# Patient Record
Sex: Female | Born: 1935 | Race: White | Hispanic: No
Health system: Southern US, Community
[De-identification: ages and names within clinical notes are randomized; demographics above are authoritative.]

---

## 2003-04-24 ENCOUNTER — Other Ambulatory Visit: Payer: Self-pay

## 2004-03-10 ENCOUNTER — Ambulatory Visit: Payer: Self-pay | Admitting: Family Medicine

## 2006-04-04 ENCOUNTER — Ambulatory Visit: Payer: Self-pay | Admitting: Ophthalmology

## 2006-08-22 ENCOUNTER — Ambulatory Visit: Payer: Self-pay | Admitting: Family Medicine

## 2006-12-07 ENCOUNTER — Ambulatory Visit: Payer: Self-pay | Admitting: Family Medicine

## 2007-04-03 ENCOUNTER — Emergency Department: Payer: Self-pay | Admitting: Emergency Medicine

## 2007-04-04 ENCOUNTER — Ambulatory Visit: Payer: Self-pay | Admitting: Emergency Medicine

## 2007-04-14 ENCOUNTER — Inpatient Hospital Stay (HOSPITAL_COMMUNITY): Admission: RE | Admit: 2007-04-14 | Discharge: 2007-04-18 | Payer: Self-pay | Admitting: Neurosurgery

## 2007-04-18 ENCOUNTER — Encounter: Payer: Self-pay | Admitting: Internal Medicine

## 2007-11-11 ENCOUNTER — Other Ambulatory Visit: Payer: Self-pay

## 2007-11-12 ENCOUNTER — Inpatient Hospital Stay: Payer: Self-pay | Admitting: *Deleted

## 2010-06-30 NOTE — Discharge Summary (Signed)
NAMEDESHARA, ROSSI NO.:  [id number]   MEDICAL RECORD NO.:  [id number]          PATIENT TYPE:  INP   LOCATION:  3036                         FACILITY:  [facility]   PHYSICIAN:  Danae Orleans. Venetia Maxon, M.D.  DATE OF BIRTH:  Sep 20, 1916   DATE OF ADMISSION:  04/14/2007  DATE OF DISCHARGE:  04/18/2007                               DISCHARGE SUMMARY   ADMISSION DIAGNOSES:  Herniated cervical disk with cervical myelopathy,  spondylosis, myelopathy, cervical stenosis, and kyphotic cervical  deformity with transient quadriparesis, status post fall.   HISTORY OF PRESENT ILLNESS:  She underwent an ACDF of C3-4, C5-6 and  recovered in the Intensive Care Unit for 1 night. Her postoperative stay  has been uneventful. Mild dysphagia is expected. Her incision is without  any signs of infection, inflammation, or tenderness. Of note is also the  finding of rib fractures, also resulting from that fall. She is found to  have benefit from a skilled nursing facility during her recovery, as her  family is unable to provide that at this time. Plans are underway to  transfer Ms. Arzuaga to [facility] for this care. She will followup in the  office in 2 to 4 weeks.   ALLERGIES:  PENICILLIN, SULFA.   CURRENT MEDICATIONS:  1. Boniva 150 mg taken p.o. on the 30th of each month.  2. Vitamin C 500 mg daily.  3. Tylenol Arthritis p.o. p.r.n.  4. Glucosamine b.i.d.  5. Calcium 600 mg with vitamin D twice daily.  6. Pravastatin 40 mg q. 0800 hours daily.  7. Primidone 125 mg daily.  8. Syllium fiber pack, 1 packet q.h.s.  9. Hydrocodone 5/325 1 to 2 p.o. q.4 to 6 hours p.r.n. pain.  10.Percocet 4/325 1 to 2 p.o. q.4 to 6 hours p.r.n. pain.  11.Flexeril 5 mg 1 p.o. q.8 hours p.r.n. spasm.  12.Valium 5 mg 1 p.o. q.8 hours p.r.n. spasm.  13.Ativan 0.5 mg daily p.r.n.  14.Ambien 5 mg q.h.s. p.r.n. insomnia.   FOLLOWUP:  The patient will followup in the office with [place] in 2 to 4  weeks.      Danae Orleans. Venetia Maxon, M.D.  Electronically Signed     JDS/MEDQ  D:  04/18/2007  T:  04/18/2007  Job:  [id number]

## 2010-06-30 NOTE — Op Note (Signed)
Tricia Mcmahon, Tricia Mcmahon NO.:  [id number]   MEDICAL RECORD NO.:  [id number]          PATIENT TYPE:  INP   LOCATION:  2899                         FACILITY:  [facility]   PHYSICIAN:  Danae Orleans. Venetia Maxon, M.D.  DATE OF BIRTH:  22-Apr-1916   DATE OF PROCEDURE:  04/14/2007  DATE OF DISCHARGE:                               OPERATIVE REPORT   PREOPERATIVE DIAGNOSIS:  Herniated cervical disc with cervical  myelopathy, spondylosis, myelopathy, cervical stenosis, and kyphotic  cervical deformity with transient quadriparesis status post fall.   POSTOPERATIVE DIAGNOSIS:  Herniated cervical disc with cervical  myelopathy, spondylosis, myelopathy, cervical stenosis, and kyphotic  cervical deformity with transient quadriparesis status post fall.   PROCEDURE:  Anterior cervical decompression and fusion C3-C4 and C5-C6  with allograft and autograft anterior cervical plate C3 through C6  level.   SURGEON:  Danae Orleans. Venetia Maxon, M.D.   ASSISTANT:  Stefani Dama, M.D.  Georgiann Cocker, R.N.   ANESTHESIA:  General endotracheal anesthesia.   BLOOD LOSS:  Minimal.   COMPLICATIONS:  None.   DISPOSITION:  Recovery.   INDICATIONS:  Tricia Mcmahon is an 77 year old lady who fell and struck  her head and was rendered transiently quadriplegic.  She subsequently  improved but had an MRI at [facility] which  demonstrated marked spinal stenosis with cord edema at C3-C4 and also,  to a lesser degree, at the C5-C6 level.  She appeared to have autofusion  at C4-C5.  It was elected to take her to surgery for decompression and  fusion.   OPERATIVE PROCEDURE:  [name] was brought to the operating room.  Following satisfactory and uncomplicated induction of general  endotracheal anesthesia and placement of intravenous lines, the patient  was placed in a supine position on the operating table.  Her neck was  maintained in neutral alignment.  She was placed in 5 pounds halter  traction.  Her anterior neck was then prepped and draped in the usual  sterile fashion.  The area of planned incision was infiltrated with  0.25% Marcaine and 0.5% lidocaine with 1:200,000 epinephrine.  An  incision was made in the midline to the anterior border of the  sternocleidomastoid muscle, carried sharply through platysma layer.  Subplatysmal dissection was performed exposing the anterior border  of  the sternocleidomastoid muscle using blunt dissection. The carotid  sheath was kept lateral and trachea and esophagus kept medial. Bent  spinal needles were placed at what was felt to be the C3-C4 and C4-C5  levels and this was confirmed on intraoperative x-ray.  Subsequently,  the longus colli muscles were taken down from the anterior cervical  spine bilaterally using electrocautery, Key elevator, and self-retaining  Shadowline up and down retractors were placed to facilitate exposure.  Ventral osteophytes were removed.  The patient had significant  osteopenia. The C4-C5 level on closer inspection did, in fact, appear to  be autofused and consequently, it was elected to perform decompression  at C3-C4 and C5-C6 levels.  The distraction pins were placed at C3 and  C4.  The interspace was opened.  The bone was quite  osteopenic. There  was a large osteophyte.  The bone spurs were removed and the spinal cord  dura was decompressed. Under microscopic visualization, there appeared  to be fusion of the ligaments to the dura, the ligament was removed  almost in its entirety and the dura appeared to be well decompressed.  Hemostasis was assured with Gelfoam soaked in thrombin.  After trial  sizing, a 7 mm bone allograft wedge was selected, fashioned with a high  speed drill, packed with morcellized bone autograft and demineralized  bone matrix, inserted in the interspace and countersunk appropriately.   Attention was then turned to the C5-C6 level where similar placement of  distraction pins  was performed.  This level was quite degenerated.  There was a large amount of osteophytic of bone and this was removed.  The posterior longitudinal ligament was removed and this appeared to be  less fused to the dura and consequently, the dura was more extensively  decompressed bilaterally along with decompression along the nerve roots  bilaterally.  Hemostasis was again assured and after trial sizing, a 6  mm allograft bone wedge was selected, fashioned with a high speed drill,  packed with morcellized bone autograft and demineralized bone matrix,  inserted in the interspace and countersunk appropriately.  A three level  anterior cervical plate was then straightened somewhat to be fixed  better to the spine and was affixed to the cervical spine with variable  angle 14 mm screws, two at C3, two at C4, two at C5, and two at C6.  All  screws had excellent purchase and locking mechanisms were engaged.  The  intervening screws at C4 and C5 did appear to be in fairly soft bone,  but the C3 and C6 screws appeared to have good purchase.  Final x-ray  demonstrated well positioned interbody grafts and anterior cervical  plate.   Hemostasis was assured.  The soft tissues were inspected and found to be  in good repair.  The platysmal layer was then closed with 3-0 Vicryl  sutures and the skin edges were reapproximated with 3-0 Vicryl  interrupted inverted sutures.  The wound was dressed with Dermabond.  The patient was extubated in the operating room and taken to the  recovery room in stable satisfactory condition, having tolerated the  operation well.  Counts were correct at the end of the case.      Danae Orleans. Venetia Maxon, M.D.  Electronically Signed     JDS/MEDQ  D:  04/14/2007  T:  04/15/2007  Job:  [id number]

## 2010-11-09 LAB — CBC
HCT: 36.2
Hemoglobin: 12.2
MCHC: 33.6
MCV: 99.1
Platelets: 170
RDW: 13.6

## 2010-11-09 LAB — BASIC METABOLIC PANEL
BUN: 9
CO2: 28
Chloride: 102
Glucose, Bld: 99
Potassium: 4.3
Sodium: 136

## 2011-02-14 ENCOUNTER — Inpatient Hospital Stay: Payer: Self-pay | Admitting: Internal Medicine

## 2011-02-16 LAB — COMPREHENSIVE METABOLIC PANEL
Albumin: 2.5 g/dL — ABNORMAL LOW (ref 3.4–5.0)
Alkaline Phosphatase: 40 U/L — ABNORMAL LOW (ref 50–136)
Bilirubin,Total: 0.3 mg/dL (ref 0.2–1.0)
Calcium, Total: 8.3 mg/dL — ABNORMAL LOW (ref 8.5–10.1)
Glucose: 105 mg/dL — ABNORMAL HIGH (ref 65–99)
Osmolality: 279 (ref 275–301)
Potassium: 3.7 mmol/L (ref 3.5–5.1)
SGPT (ALT): 70 U/L

## 2011-02-16 LAB — CK: CK, Total: 1869 U/L — ABNORMAL HIGH (ref 21–215)

## 2011-02-16 LAB — CBC WITH DIFFERENTIAL/PLATELET
Basophil %: 0.2 %
Eosinophil #: 0.1 10*3/uL (ref 0.0–0.7)
HCT: 32.5 % — ABNORMAL LOW (ref 35.0–47.0)
Lymphocyte %: 12.4 %
MCH: 33.7 pg (ref 26.0–34.0)
MCV: 100 fL (ref 80–100)
Neutrophil #: 6.6 10*3/uL — ABNORMAL HIGH (ref 1.4–6.5)
RBC: 3.25 10*6/uL — ABNORMAL LOW (ref 3.80–5.20)
RDW: 13.9 % (ref 11.5–14.5)

## 2011-02-17 LAB — CBC WITH DIFFERENTIAL/PLATELET
Basophil #: 0 10*3/uL (ref 0.0–0.1)
HCT: 34.6 % — ABNORMAL LOW (ref 35.0–47.0)
Lymphocyte #: 1.2 10*3/uL (ref 1.0–3.6)
MCH: 33.7 pg (ref 26.0–34.0)
MCHC: 33.5 g/dL (ref 32.0–36.0)
MCV: 101 fL — ABNORMAL HIGH (ref 80–100)
Monocyte %: 10.4 %
Neutrophil #: 4.5 10*3/uL (ref 1.4–6.5)
RDW: 14 % (ref 11.5–14.5)
WBC: 6.4 10*3/uL (ref 3.6–11.0)

## 2011-02-17 LAB — COMPREHENSIVE METABOLIC PANEL
Albumin: 2.7 g/dL — ABNORMAL LOW (ref 3.4–5.0)
BUN: 15 mg/dL (ref 7–18)
Bilirubin,Total: 0.3 mg/dL (ref 0.2–1.0)
Chloride: 102 mmol/L (ref 98–107)
Creatinine: 0.57 mg/dL — ABNORMAL LOW (ref 0.60–1.30)
EGFR (African American): >60
SGPT (ALT): 65 U/L
Total Protein: 6.9 g/dL (ref 6.4–8.2)

## 2011-02-19 ENCOUNTER — Encounter: Payer: Self-pay | Admitting: Internal Medicine

## 2011-03-19 ENCOUNTER — Encounter: Payer: Self-pay | Admitting: Internal Medicine

## 2011-09-09 ENCOUNTER — Ambulatory Visit: Payer: Self-pay | Admitting: Ophthalmology

## 2011-09-09 LAB — HEMOGLOBIN: HGB: 13 g/dL (ref 12.0–16.0)

## 2011-09-11 ENCOUNTER — Ambulatory Visit: Payer: Self-pay | Admitting: Ophthalmology

## 2011-10-14 ENCOUNTER — Ambulatory Visit: Payer: Self-pay | Admitting: Ophthalmology

## 2012-07-08 ENCOUNTER — Emergency Department: Payer: Self-pay | Admitting: Emergency Medicine

## 2012-07-08 LAB — CBC
MCV: 100 fL (ref 80–100)
RBC: 3.22 10*6/uL — ABNORMAL LOW (ref 3.80–5.20)

## 2012-07-08 LAB — COMPREHENSIVE METABOLIC PANEL
Co2: 20 mmol/L — ABNORMAL LOW (ref 21–32)
Creatinine: 1.08 mg/dL (ref 0.60–1.30)
Osmolality: 279 (ref 275–301)
SGPT (ALT): 12 U/L (ref 12–78)
Total Protein: 7.1 g/dL (ref 6.4–8.2)

## 2012-07-16 DEATH — deceased

## 2012-12-22 IMAGING — US ABDOMEN ULTRASOUND
1 series · 17 of 25 positions shown · non-contrast
Comparison: none

REASON FOR EXAM: abnormal LFTs
COMMENTS:

PROCEDURE:     US  - US ABDOMEN GENERAL SURVEY  - February 14, 2011  [DATE]
RESULT:     Comparison: None
TECHNIQUE: Multiple gray-scale and color-flow Doppler images of the abdomen
are presented for review.

[Series 1: abdomen ultrasound · 17 of 105 slices shown]
[im 1/105]
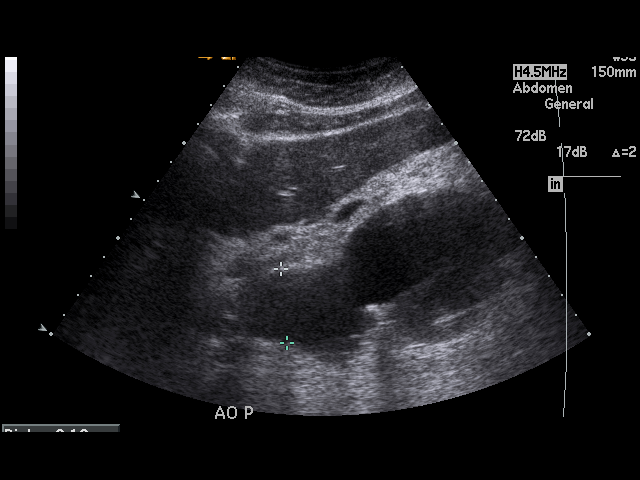
[im 9/105]
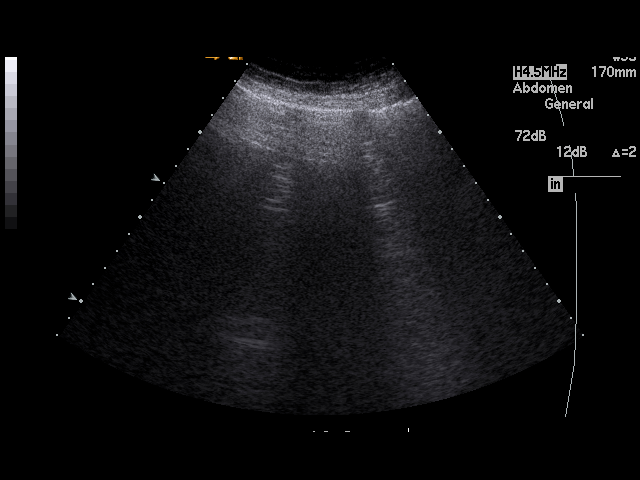
[im 14/105]
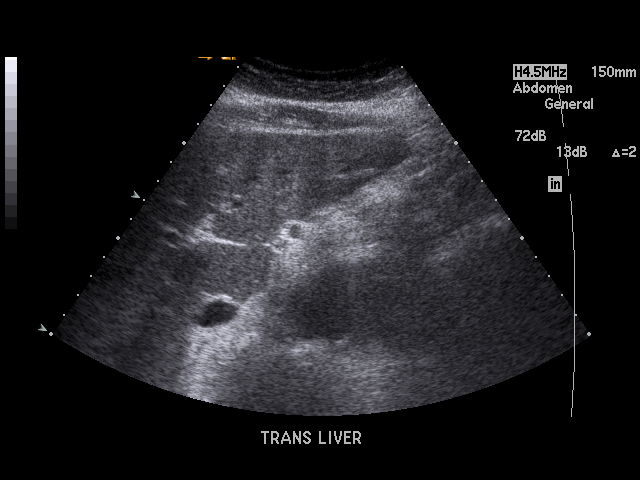
[im 22/105]
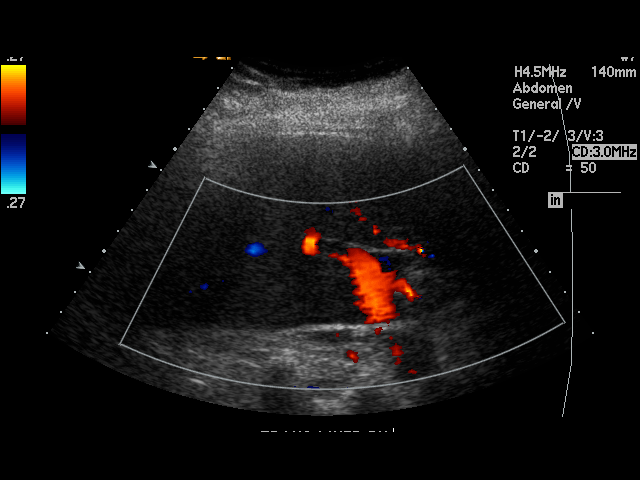
[im 27/105]
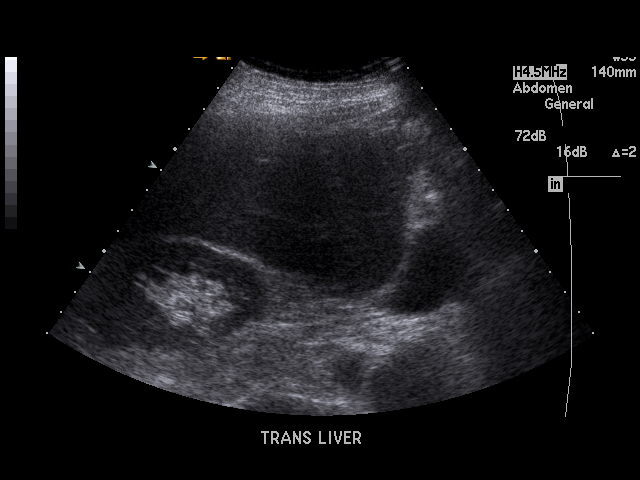
[im 35/105]
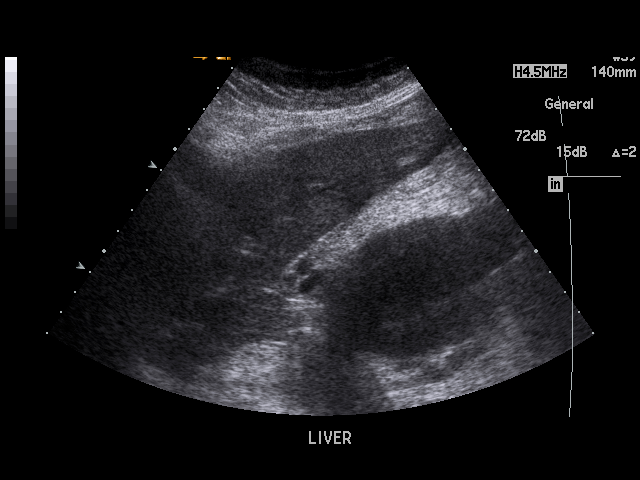
[im 40/105]
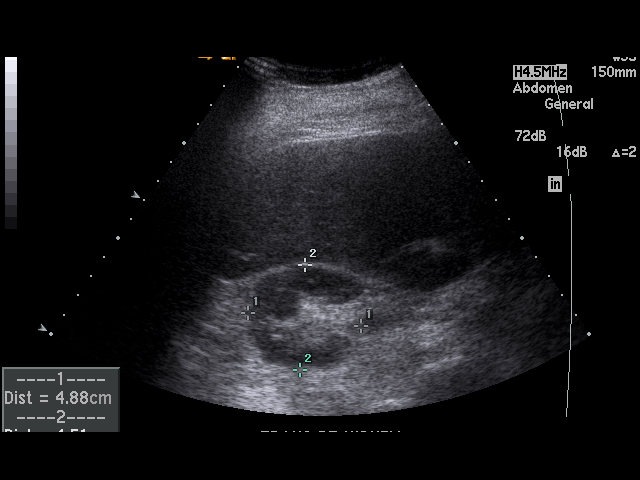
[im 48/105]
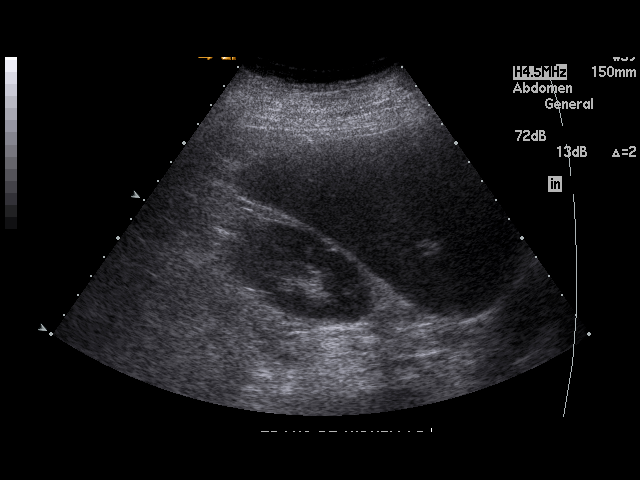
[im 53/105]
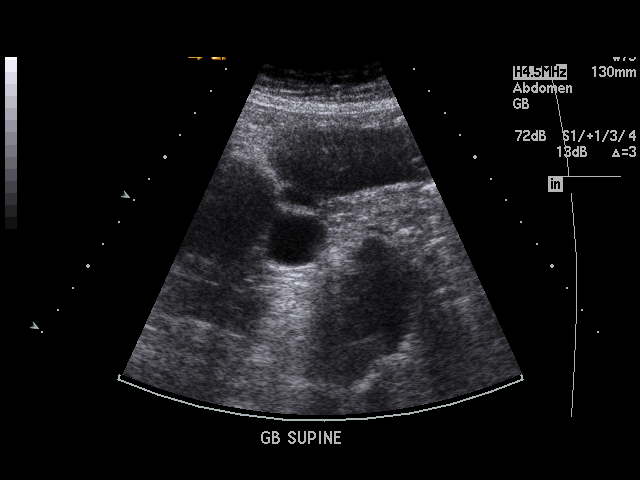
[im 57/105]
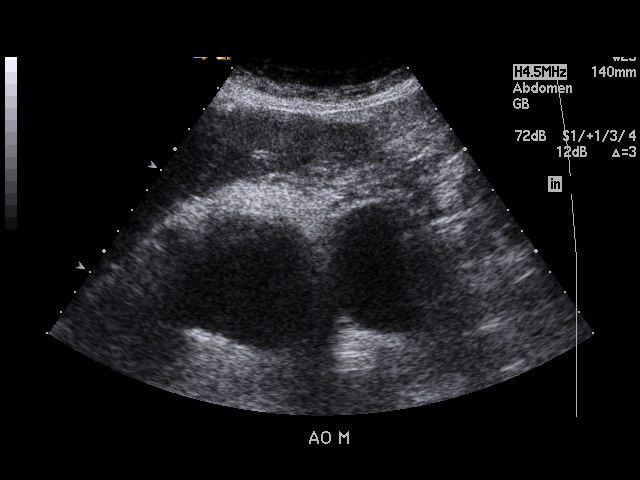
[im 66/105]
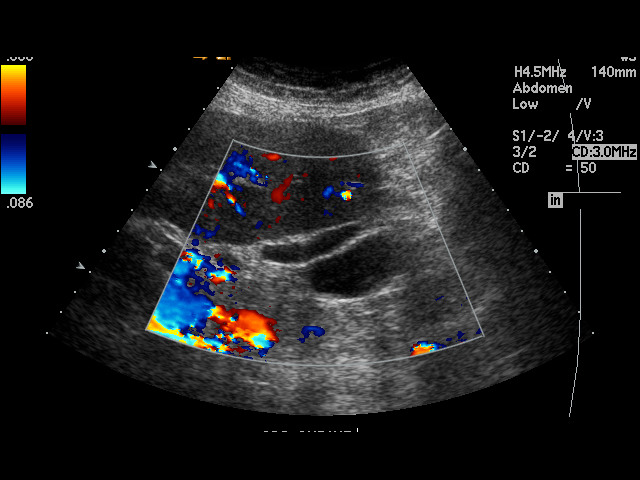
[im 70/105]
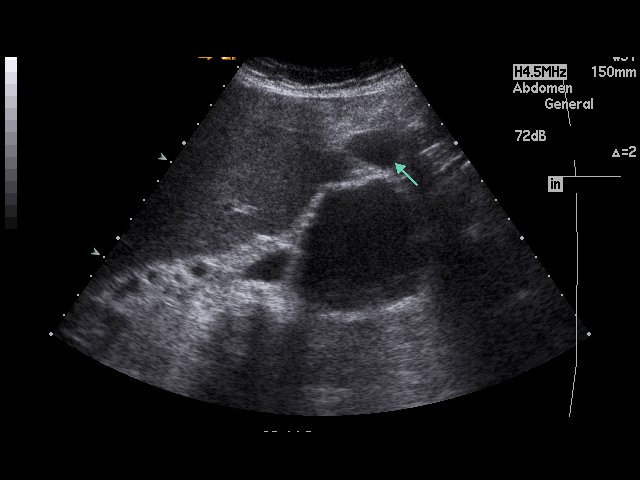
[im 79/105]
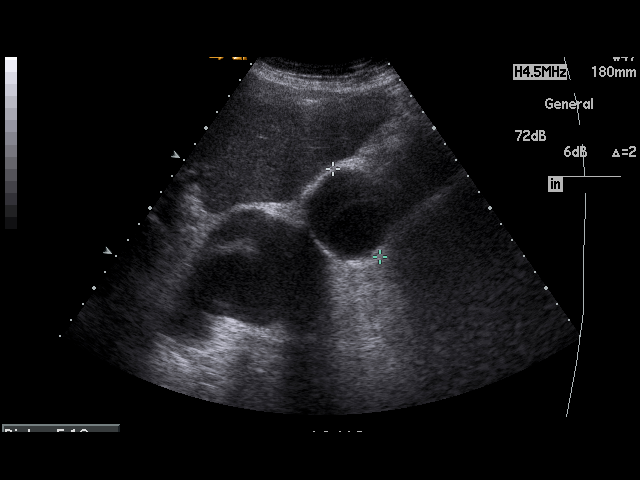
[im 83/105]
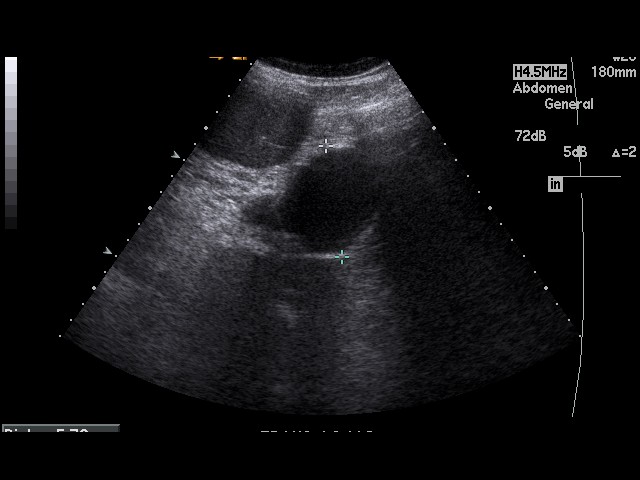
[im 92/105]
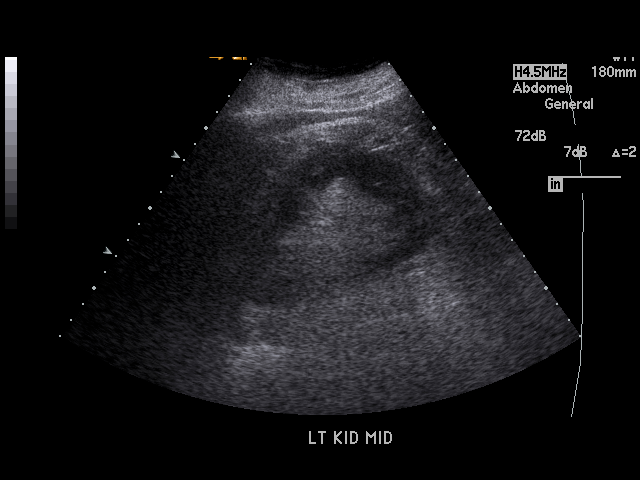
[im 96/105]
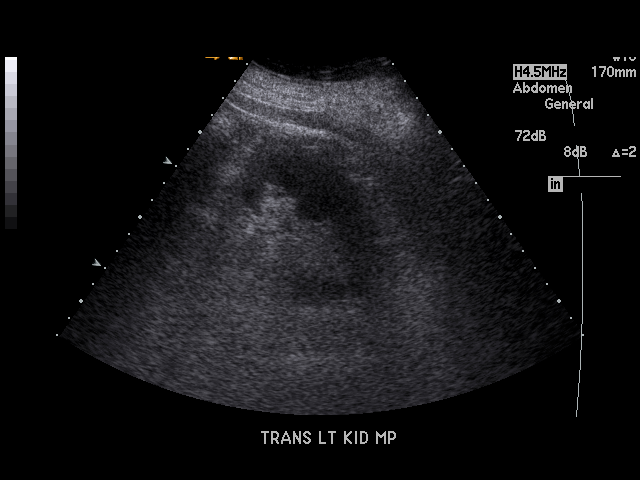
[im 105/105]
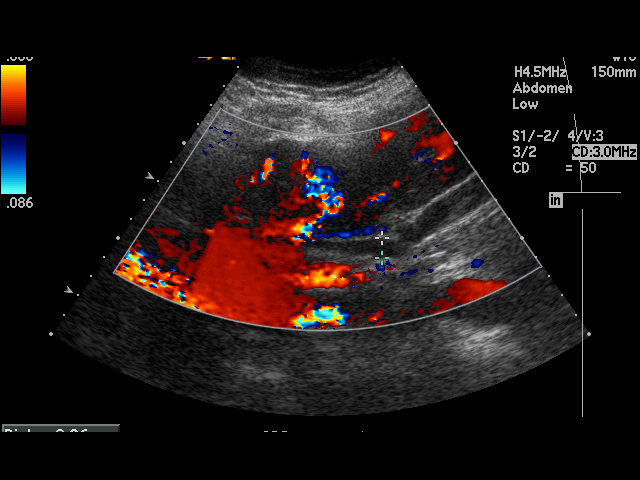

[17 of 25 positions shown; findings below may reference images not displayed]

FINDINGS: Visualized portions of the liver demonstrate normal echogenicity and normal
contours. The liver is without evidence of a focal hepatic lesion.

There is no cholelithiasis or biliary sludge. There is no intra- or
extrahepatic biliary ductal dilatation. The common duct measures 9 mm in
maximal diameter which is likely within normal limits for the patient's age.
There is no gallbladder wall thickening, pericholecystic fluid, or
sonographic Murphy's sign.

The visualized portion of the pancreas is normal in echogenicity. The spleen
is unremarkable. Bilateral kidneys are normal in echogenicity and size. The
right kidney measures 13.2 x 4.9 x 4.5 cm. The left kidney measures 13 x
x 8.6 cm. There are no renal calculi or hydronephrosis. There is an
infrarenal abdominal aortic aneurysm measuring 6.8 x 6.4 cm.
IMPRESSION: 1. No cholelithiasis or sonographic evidence of acute cholecystitis.
2. Infrarenal abdominal aortic aneurysm measuring 6.8 x 6 cm.
3. There is a trace left pleural effusion.

## 2012-12-22 IMAGING — CR DG CHEST 2V
1 series · 2 of 2 positions shown · non-contrast
Comparison: none

REASON FOR EXAM: fever
COMMENTS:   May transport without cardiac monitor

PROCEDURE:     DXR - DXR CHEST PA (OR AP) AND LATERAL  - February 14, 2011  [DATE]
RESULT:     Comparison: None

[Series 1: ap · 0.17mm/px · 2 of 2 slices shown]
[im 1/2]
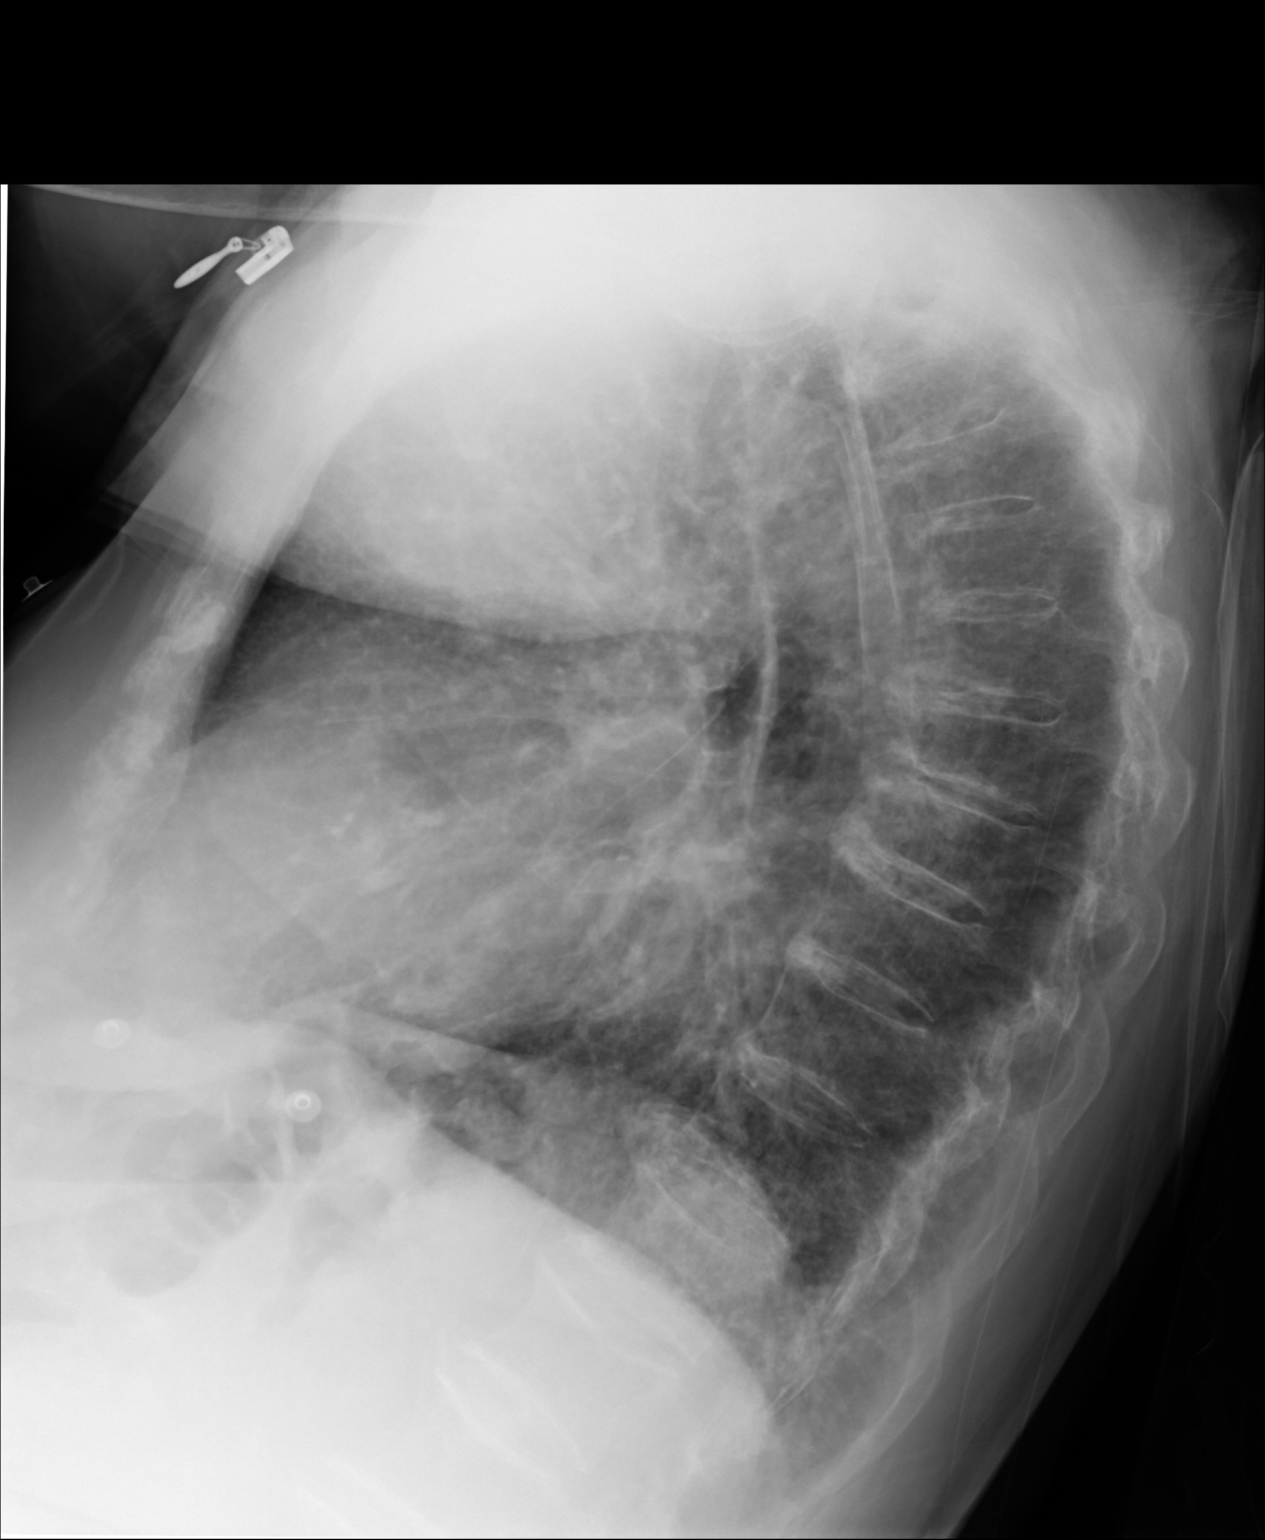
[im 2/2]
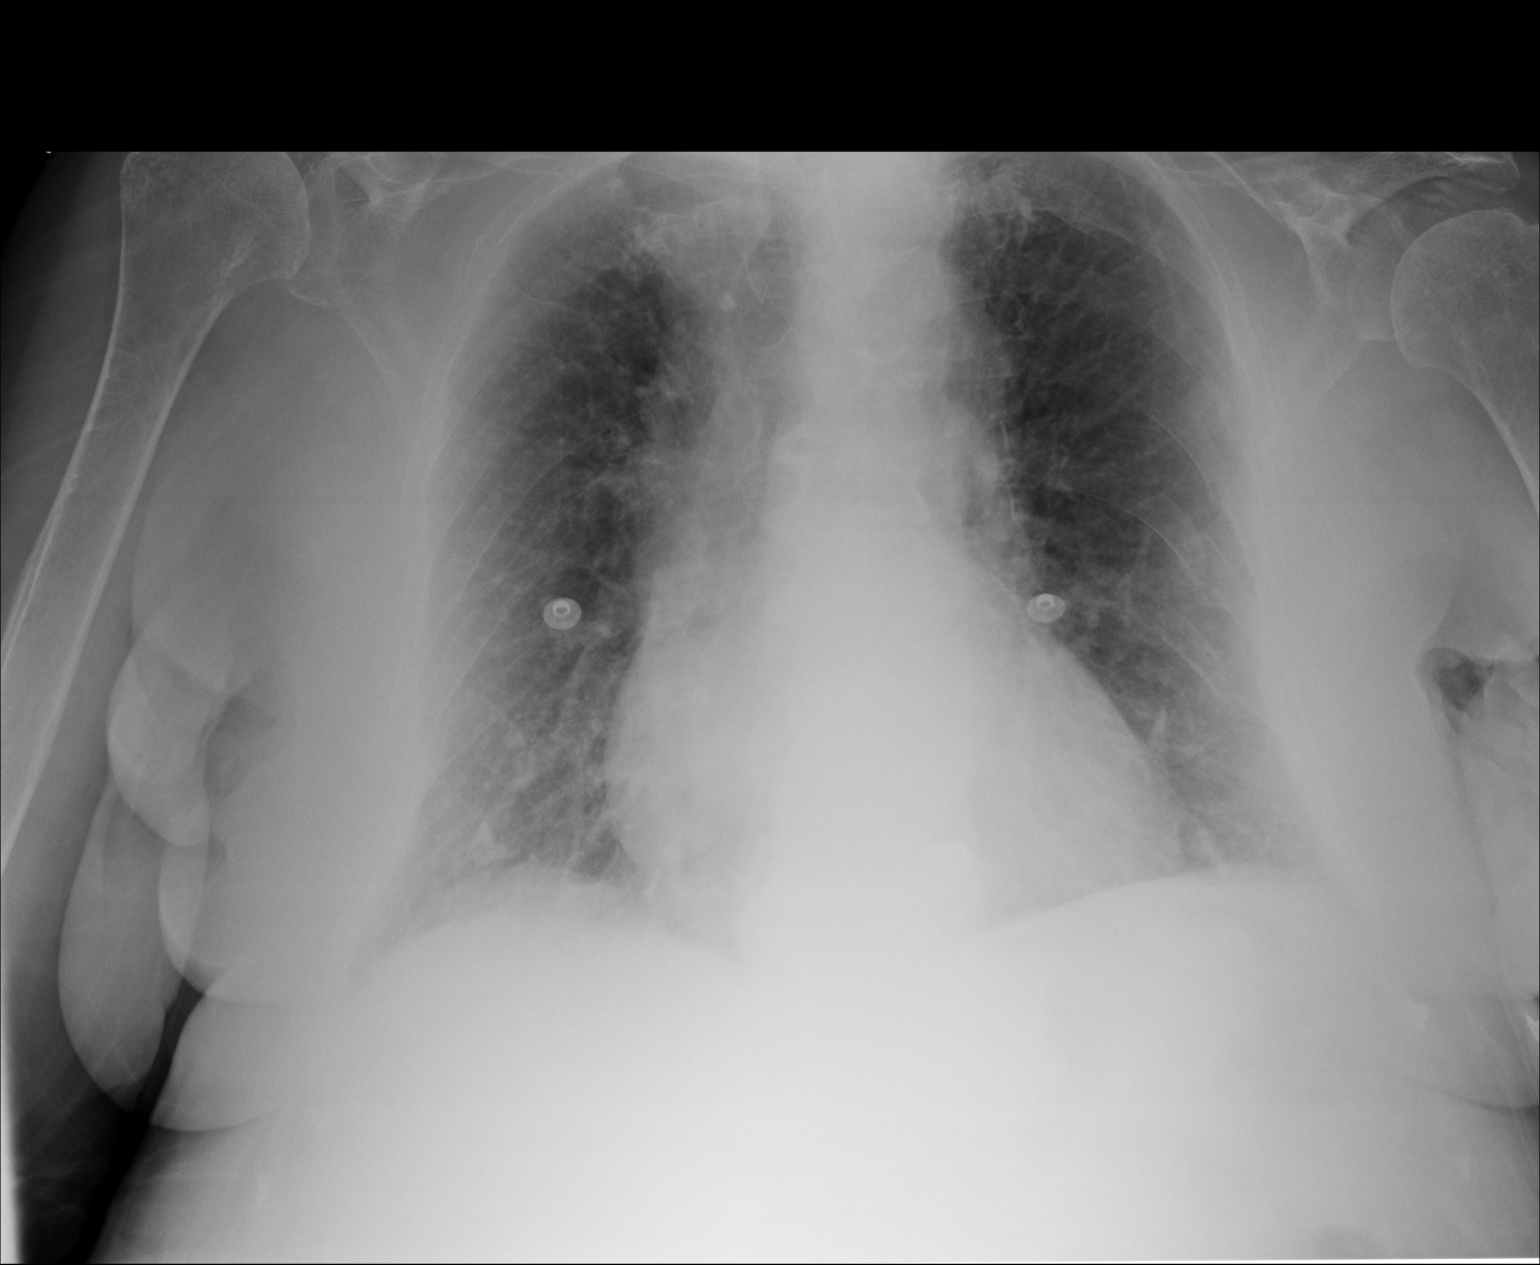

[2 of 2 positions shown; findings below may reference images not displayed]

FINDINGS: PA and lateral chest radiographs are provided. There is mild diffuse
interstitial thickening which may reflect interstitial pneumonitis versus
interstitial edema. There is no focal parenchymal opacity, pleural effusion,
or pneumothorax. The heart size is enlarged.

There multiple old left rib fractures.
IMPRESSION: Mild diffuse interstitial thickening which may reflect mild interstitial
edema versus interstitial pneumonitis secondary to an infectious or
inflammatory etiology.

## 2012-12-22 IMAGING — CT CT CHEST W/ CM
2 series · 16 of 30 positions shown, 19 images · non-contrast
Comparison: None

REASON FOR EXAM: SOB, tachycardia, ?PE
COMMENTS:

PROCEDURE:     CT  - CT CHEST (FOR PE) W  - February 14, 2011  [DATE]
RESULT:     Indications: Shortness of breath
TECHNIQUE: A thin-section spiral CT from the lung apices to the upper
abdomen was acquired on a multi slice scanner following 100ml Nsovue-6CZ
intravenous contrast. These images were then transferred to the Siemens work
station and were subsequently reviewed utilizing 3-D reconstructions and MIP
images.

[Series 4: soft tissue · axial · 0.65mm/px · z∈[-584,-420]mm · 5 of 136 slices shown]
[im 10/136  mediastinal]
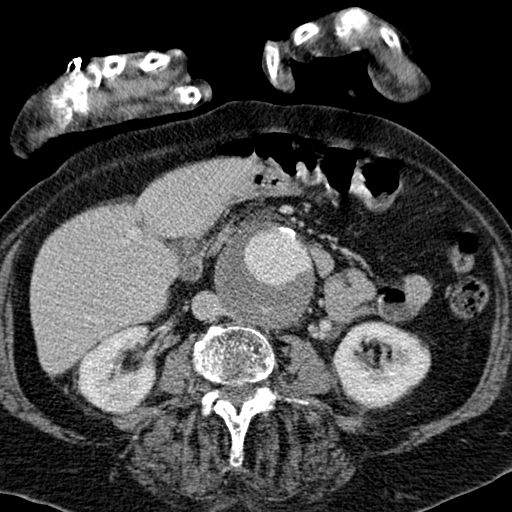
[im 28/136  mediastinal]
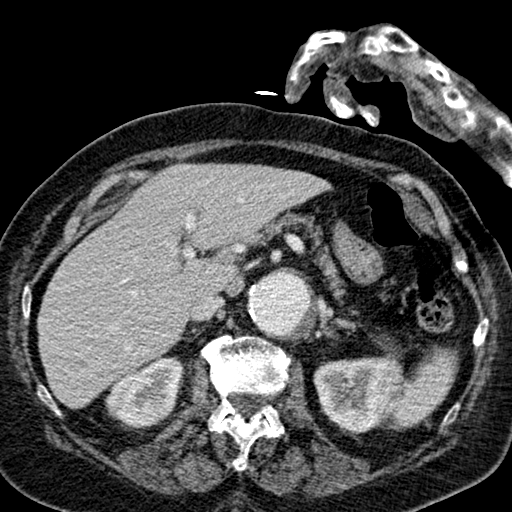
[im 46/136  mediastinal]
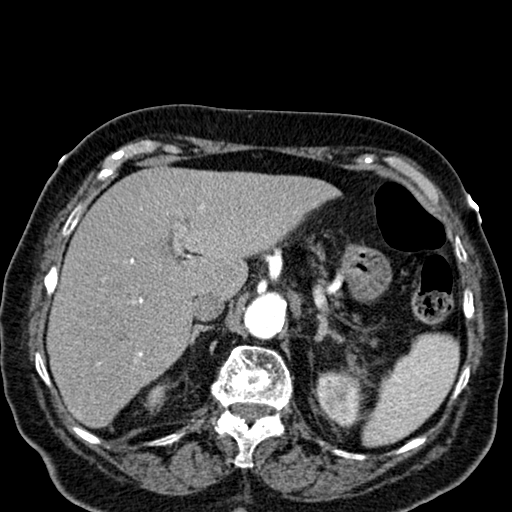
[im 64/136  mediastinal]
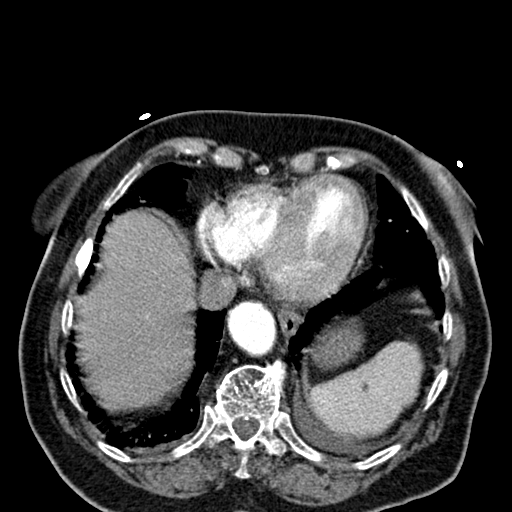
[im 73/136  mediastinal]
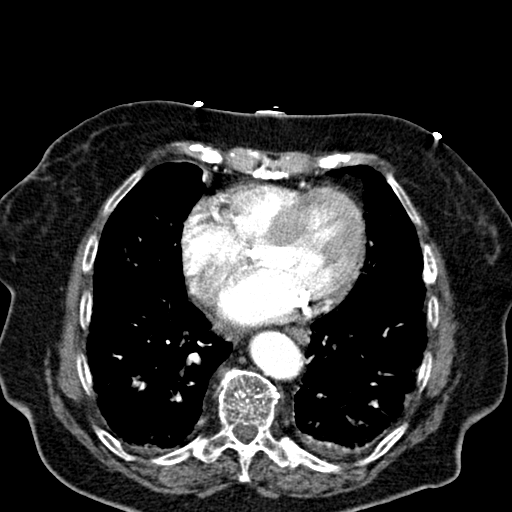

[Series 5: lung windows · axial · 0.65mm/px · z∈[-492,-258]mm · 11 of 96 slices shown, 14 images]
[im 9/96  mediastinal]
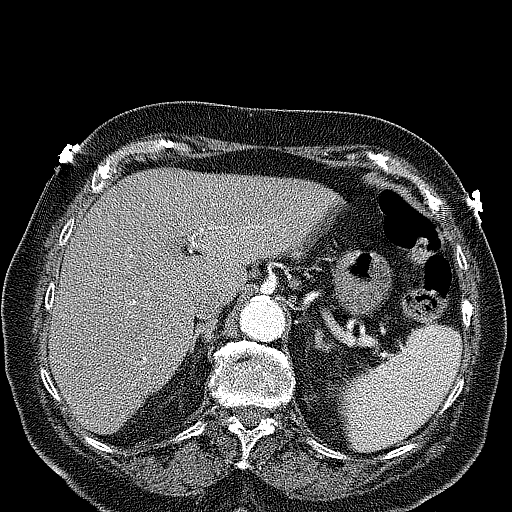
[im 9/96  lung]
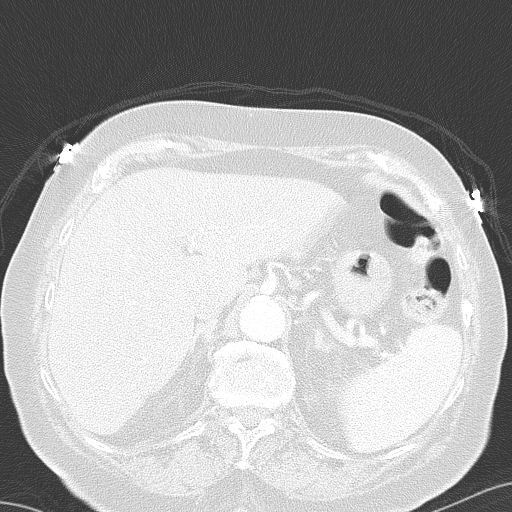
[im 18/96  lung]
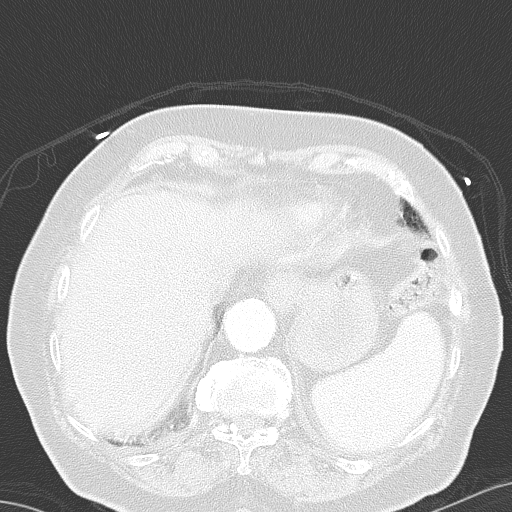
[im 26/96  lung]
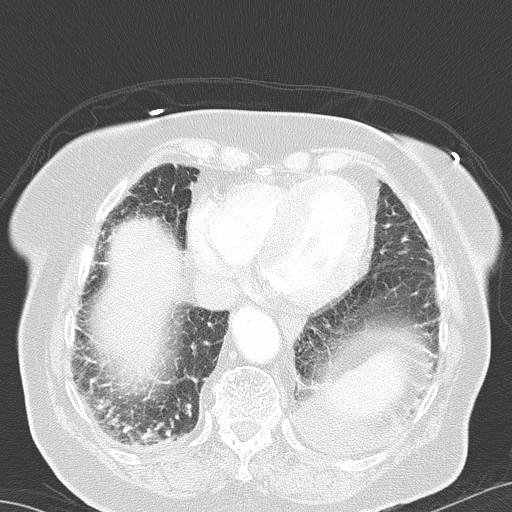
[im 30/96  lung]
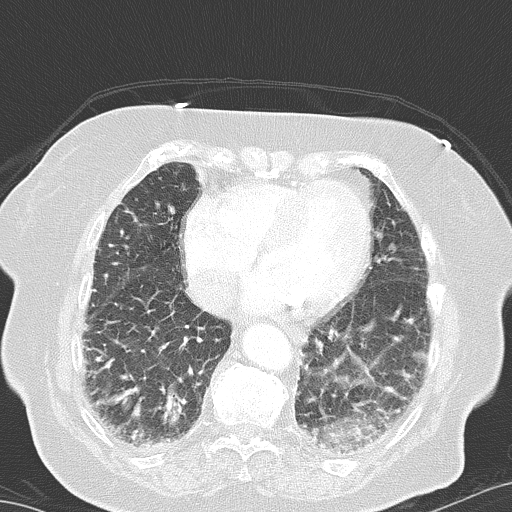
[im 35/96  mediastinal]
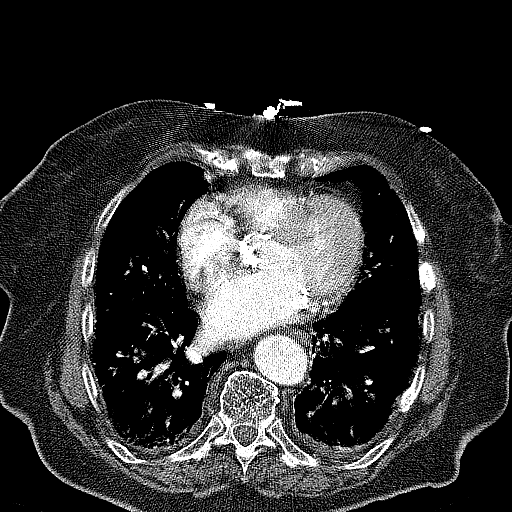
[im 35/96  lung]
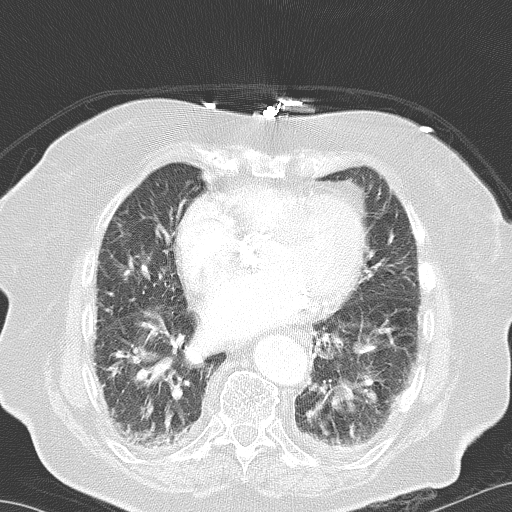
[im 44/96  lung]
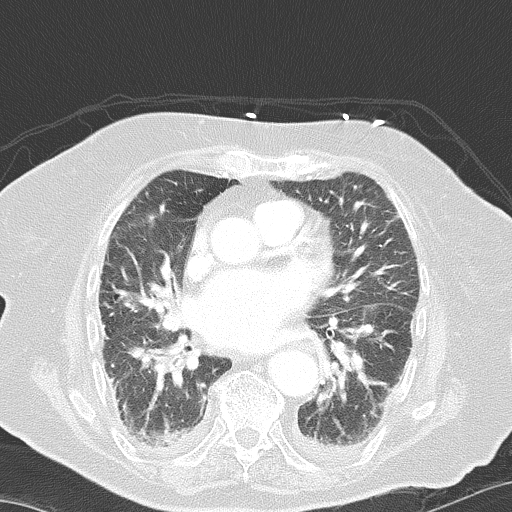
[im 52/96  lung]
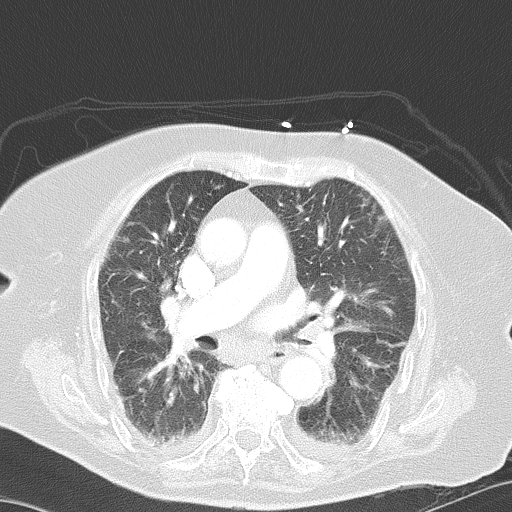
[im 61/96  lung]
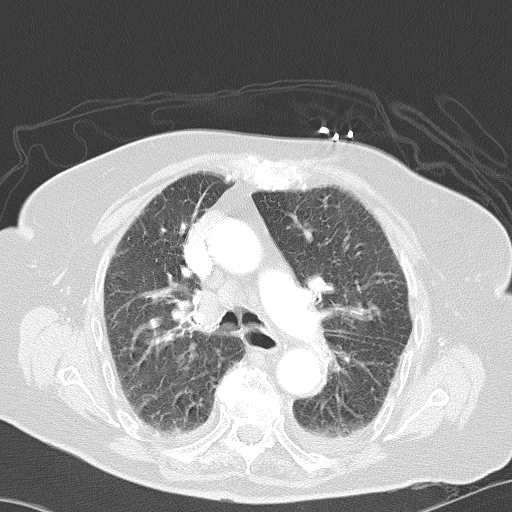
[im 70/96  mediastinal]
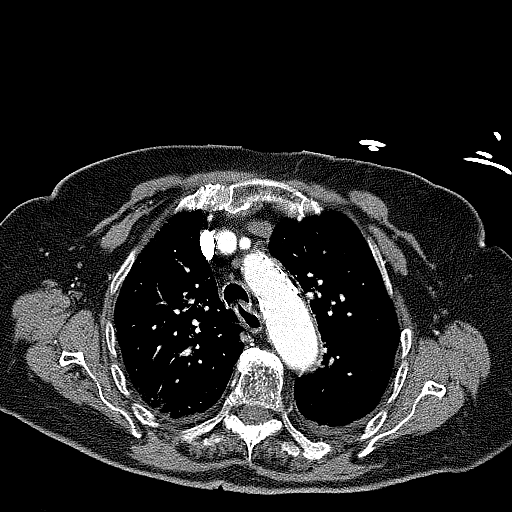
[im 70/96  lung]
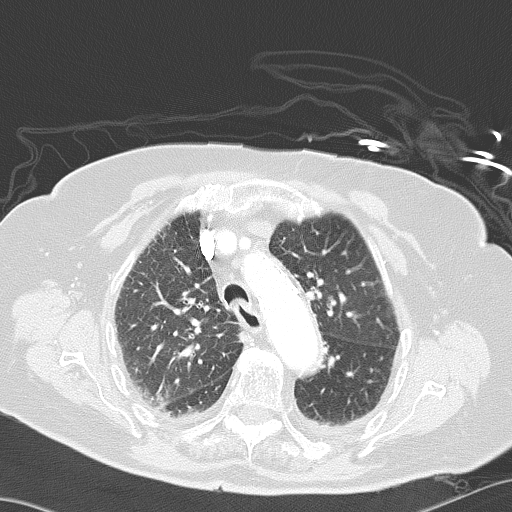
[im 78/96  lung]
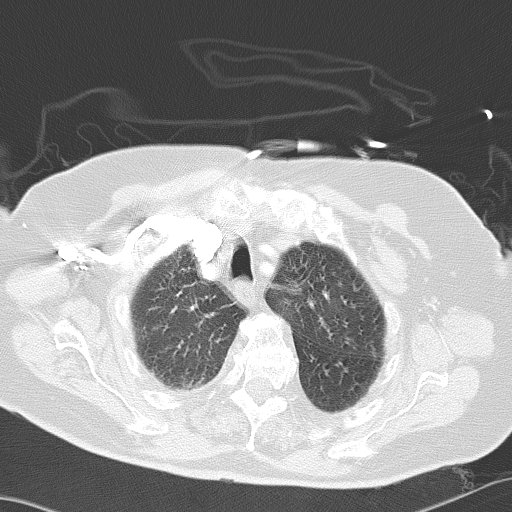
[im 87/96  lung]
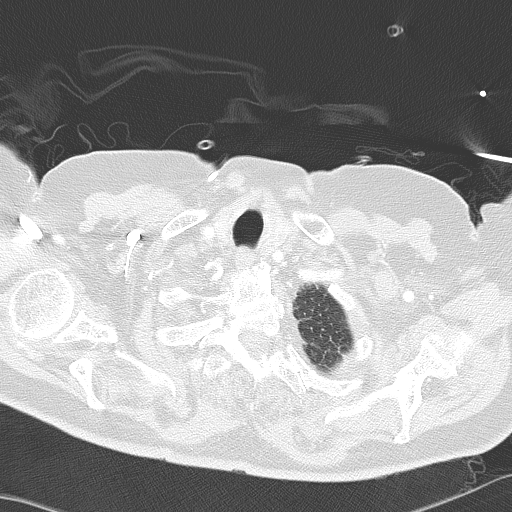

[16 of 30 positions shown; findings below may reference images not displayed]

FINDINGS: There is adequate opacification of the pulmonary arteries. There is no
pulmonary embolus. The main pulmonary artery, right main pulmonary artery,
and left main pulmonary arteries are normal in size. The heart size is
normal. There is no pericardial effusion. There is coronary artery
atherosclerosis.

The lungs are clear. There are trace bilateral pleural effusions. There is
bilateral diffuse interstitial thickening concerning for mild pulmonary
edema. There is no focal consolidation or pneumothorax.

There is no axillary, hilar, or mediastinal adenopathy.

The osseous structures are unremarkable.

There is a 6.9 x 6.7 cm infrarenal abdominal aortic aneurysm with a large
amount of mural thrombus.
IMPRESSION: 1. No CT evidence of pulmonary embolus.

2. Findings concerning for pulmonary edema.

3. There is a 6.9 x 6.7 cm infrarenal abdominal aortic aneurysm with a large
amount of mural thrombus.

## 2014-06-04 NOTE — Op Note (Signed)
PATIENT NAME:  Tricia Mcmahon, SCHMADER MR#:  [id number] DATE OF BIRTH:  1917-01-06  DATE OF PROCEDURE:  10/14/2011  PROCEDURES PERFORMED:  1. Pars plana vitrectomy of the left eye.  2. Oil injection of the left eye.  3. Endolaser left eye.   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   POSTOPERATIVE DIAGNOSES:  1. Repeat rhegmatogenous retinal detachment.  2. Choroidal hemorrhage.   PRIMARY SURGEON: Cline Cools, M.D.   ANESTHESIA: Retrobulbar block of the left eye with monitored anesthesia care.   COMPLICATION: Choroidal hemorrhage.   INDICATIONS FOR PROCEDURE: This is a patient who presented to my office one month status post a retinal detachment repair that developed a new tear and repeat retinal detachment that was macula off. The risks, benefits, and alternatives of the above procedure were discussed and the patient wished to proceed.   DETAILS OF PROCEDURE: After informed consent was obtained, the patient was brought to the operative suite at [facility]. The patient was placed in the supine position and was given a small dose of alfentanil and a retrobulbar block was performed on the left eye by the primary surgeon without any complication. The left eye was prepped and draped in sterile manner. After lid speculum was inserted, a 25-gauge trocar was placed inferotemporally through displaced conjunctiva in an oblique fashion. Infusion cannula was turned on and inserted through the trocar and secured in position with Steri-Strips. Two more trocars were placed in a similar fashion superotemporally and superonasally. Vitreous cutter and light pipe were introduced into the eye and peripheral vitreous was examined. Perfluoron was injected into the posterior pole. The soft-tip backflush was introduced and an air-fluid exchange was begun through the tear. Subretinal bleeding began at this point that spread through the subretinal space  that had been created by the retinal detachment. A draining posterior retinotomy was created to try to extrude the retinal blood. Pressure was elevated in the eye in order to attempt to stop the bleeding. The bleeding appeared to stop and there appeared to be a thin layer of blood throughout the posterior. Air-fluid exchange was completed and the laser was introduced. Laser was placed around the retinal tears and around the draining retinotomy.  At this time it appeared that the subretinal bleeding had increased. The soft-tip backflush was reintroduced and at this time it appeared to be increasing subretinal blood.  The backflush was  aborted and oil was placed in the eye in order to stabilize the posterior space. This was done under pressure to stop any bleeding. The eye was filled with silicone oil up to the posterior chamber IOL. Pressure in the eye was confirmed to be approximately 15 mmHg. The trocars were removed and the wounds were noted to be watertight. 7-0 Vicryl was then placed around the sclerotomy. Pressure in the eye was then checked and pressure in the eye appeared to have significantly elevated and the posterior appeared to become somewhat darkened. The  sclerotomy was opened and the oil was removed in order to bring the pressure back down; the pressure in the eye was brought back down to 10 to 15 mm mercury and the sclerotomy was closed. Three to four minutes were allowed to elapse to observe the eye in a closed system. The pressure in the eye appeared to elevate to approximately 20 to 25 mmHg. The conjunctiva was then closed and the eye was left secondary to the belief that she had begun developing a choroidal hemorrhage.  The lid speculum was removed. 5 mg of dexamethasone was given into the inferior fornix. Pressure in the eye appeared to be stabilized at approximately 25 mmHg. The eye was cleaned. The drape was removed and the eyes were compared by palpation and appeared to be equal. The pressure  in the eye was again checked and appeared to be stable at approximately 25 mmHg. Cosopt was placed on the eye and TobraDex was placed on the eye. A patch and shield were placed over the eye and the patient was taken to postanesthesia care with instructions to remain head up.   ____________________________ Ignacia FellingMatthew F. Champ MungoAppenzeller, MD mfa:bjt D: 10/14/2011 20:40:31 ET T: 10/15/2011 08:14:19 ET JOB#: [id number]  cc: Ignacia FellingMatthew F. Champ MungoAppenzeller, MD, <Dictator> Cline CoolsMATTHEW F Daelen Belvedere MD ELECTRONICALLY SIGNED 11/02/2011 6:52

## 2014-06-04 NOTE — Op Note (Signed)
PATIENT NAME:  Tricia Mcmahon, Tricia Mcmahon MR#:  [id number] DATE OF BIRTH:  02/10/1917  DATE OF PROCEDURE:  09/11/2011  PROCEDURES PERFORMED:  1. Pars plana vitrectomy of the left eye.  2. Endolaser of the left eye.  3. Gas exchange of the left eye.   PREOPERATIVE DIAGNOSIS: Rhegmatogenous retinal detachment.   POSTOPERATIVE DIAGNOSES:  1. Rhegmatogenous retinal detachment.  2. Choroidal hemorrhage.  PRIMARY SURGEON: Ignacia FellingMatthew F. Jacy Howat, MD   ANESTHESIA: Retrobulbar block of the left eye with monitored anesthesia care.   COMPLICATIONS: None.   ESTIMATED BLOOD LOSS: Less than 1 mL.  INDICATIONS FOR PROCEDURE: This is a patient who presented to my office with loss of vision in her left eye. Examination revealed a macula off rhegmatogenous retinal detachment that was mainly inferior in nature but there was track up the temporal side up to approximately 1 o'clock. Small pseudophakic breaks were identified in this area.   DETAILS OF PROCEDURE: After informed consent was obtained, the patient was brought into the operative suite at [facility]. The patient was placed in supine position, was given a small dose of propofol, and a retrobulbar block was performed on the left eye by the primary surgeon without any complications. The left eye was prepped and draped in a sterile manner. After lid speculum was inserted, a 25-gauge trocar was placed inferotemporally through displaced conjunctiva in an oblique fashion. The vitreous cutter and light pipe were introduced in the eye and a core vitrectomy was performed. Peripheral vitrectomy was then performed for 360 degrees taking special care over the retinal detachment. Perfluoron was used to flatten the posterior retina. A draining retinotomy was created at 3 o'clock. A fluid-fluid exchange was performed through this area. More Perfluoron was then placed into the eye to flatten the retina. Endolaser was introduced and four rows of laser was  placed for 180 degrees around the draining retinotomy. Endolaser was then carried for 360 degrees around the retina. It was noted at this time there was an area of choroidal elevation both at 3:30 and at 7:30. They appeared to be choroidal hemorrhages given that there did not seem to be any melanotic pigmentation. Endolaser was carried over these areas to continue the creation of a new ora serrata for 360. Perfluoron was then removed and any remnant fluid was removed through the draining retinotomy. Completion of four rows of laser was then done around the draining retinotomy. The retina was noted to be completely flat for 360 degrees. 22% SF6 was then utilized as an Systems developerair-gas exchange. The trocars were removed and the wounds were noted to be airtight. Pressure in the eye was confirmed to be approximately 15 mmHg. 5 mg of dexamethasone was given into the inferior fornix and the lid speculum was removed. The eye was cleaned and TobraDex and Cosopt were placed onto the eye. A patch and shield was then placed over the eye. The patient was taken to postanesthesia care with instructions to remain on her right side. Special attention will be paid to the areas of choroidal elevation over the next 1 to 2 weeks. If these do not resolve, then we will need further evaluation.   ____________________________ Ignacia FellingMatthew F. Champ MungoAppenzeller, MD mfa:drc D: 09/11/2011 09:09:01 ET T: 09/11/2011 10:30:19 ET JOB#: [id number]  cc: Ignacia FellingMatthew F. Champ MungoAppenzeller, MD, <Dictator> Cline CoolsMATTHEW F Mililani Murthy MD ELECTRONICALLY SIGNED 10/06/2011 12:36

## 2014-06-09 NOTE — H&P (Signed)
PATIENT NAME:  Tricia Mcmahon, Tricia Mcmahon MR#:  [id number] DATE OF BIRTH:  09/18/1916  DATE OF ADMISSION:  02/14/2011  ADDENDUM:  ASSESSMENT AND PLAN: Given the patient's generalized weakness, will also obtain frequent neuro checks for now and await CT of the head and obtain PT evaluation once condition is more stable.    ____________________________ Elon AlasKamran N. Hosie Sharman, MD knl:drc D: 02/14/2011 15:53:08 ET T: 02/14/2011 16:01:59 ET JOB#: [id number]  cc: Elon AlasKamran N. Avyaan Summer, MD, <Dictator> Leo GrosserNancy J. Maloney, MD Elon AlasKAMRAN N Jakhai Fant MD ELECTRONICALLY SIGNED 02/18/2011 16:25

## 2014-06-09 NOTE — H&P (Signed)
PATIENT NAME:  Tricia Mcmahon, GITLIN MR#:  [id number] DATE OF BIRTH:  June 11, 1916  DATE OF ADMISSION:  02/14/2011  REFERRING PHYSICIAN: Dr. Buford Dresser. PRIMARY CARE PHYSICIAN: Dr. Elease Hashimoto.   PRIMARY CARDIOLOGIST: Dr. Darrold Junker. PRIMARY GASTROENTEROLOGIST: Dr. Mechele Collin.   CHIEF COMPLAINT:  "I feel weak and short of breath."  HISTORY OF PRESENT ILLNESS: The patient is a very pleasant 81 year old female with past medical history listed below including history of peptic ulcer disease with bleeding x3 with gastric and duodenal ulcers and anemia and thrombocytopenia requiring blood and platelet transfusions, history of aortic stenosis for which the patient has opted not to undergo valve repair or replacement, history of essential tremors, knee arthritis, hypercholesterolemia, rib fractures, history of pneumonia, who presents to the Emergency Department accompanied by daughter secondary to the above-mentioned chief complaint. History was obtained from speaking with the patient and additional history is provided by the patient's daughter who accompanies her today. The patient developed generalized weakness yesterday. It all started when she was using the bathroom and was unable to get off the toilet and was noted to be on the commode for at least two hours. She called her [facility] and EMT came to the house and helped get her off the commode and placed her in a chair. Later that night she was able to get some rest and she did sleep through the night. This morning upon awakening she had once again noticed generalized weakness. There was some initial difficulty getting the patient up out of bed and the patient's daughter was eventually able to help her out of bed. The patient was helped to use the bathroom and then upon entering the bathroom she became very weak and had to sit on the floor and could not get up. The patient's neighbors were called and neighbors also had difficulty getting the patient up off the floor and  thereafter 911 was called and the patient was brought to the ER for further evaluation. Also, yesterday the patient was noted to have slight drooping of the right side of her face which had quickly resolved after she drank some water per her daughter. She has had no slurred speech. She has history of essential tremors and her tremors have been more pronounced over the past couple of days. She has also had shortness of breath associated with wheezing. The patient is unable to specify duration. She denies any cough. She also reports fevers and temperature on arrival was 100.5 in the ER. She denies any chest pain at this time. She denies orthopnea or lower extremity edema. She was noted to have multiple abnormalities in the ER today. She appears to be septic with elevated heart rate, relatively low blood pressure in addition to fever. Source appears to be urinary tract infection. She has a abnormal urinalysis. There was also question of possible pneumonia as chest x-ray PA and lateral revealed some diffuse interstitial thickening, which could be mild interstitial edema versus interstitial pneumonitis secondary to an infectious or inflammatory etiology. Additionally, her BMP is elevated at 5,150. Troponin is elevated at 0.11. Her LFTs are abnormal with AST 397, ALT 97 and low albumin at 3.1. Alkaline phosphatase is 39. She has a history of thrombocytopenia and today platelet count is 87 with normal hemoglobin and hematocrit and normal Prichard count. EKG reveals some lateral T wave inversions and there no prior EKGs for comparison in our system. Other than generalized weakness, shortness of breath and wheezing, the patient is without specific complaints at this time. Hospitalist services  were contacted for further evaluation and for hospital admission.   PAST SURGICAL HISTORY:  1. Multiple esophagogastroduodenoscopies with history of gastric and duodenal ulcer noted on esophagogastroduodenoscopy September 2009.   2. Cervical vertebral fusion February 2009.  3. Appendectomy.  4. Tonsillectomy.  5. Left hip surgery.   PAST MEDICAL HISTORY:  1. Hypercholesterolemia.  2. Aortic stenosis. The patient has opted not to undergo valve repair or replacement. This is being followed by Dr. Darrold Junker.  3. History of peptic ulcer disease with bleeding x3. Most recently with gastric and duodenal ulcers noted on esophagogastroduodenoscopy September 2009.  4. Anemia and thrombocytopenia secondary to gastrointestinal bleed requiring blood and platelet transfusion.  5. Essential tremor.  6. Knee arthritis.  7. History of rib fractures.  8. History of pneumonia.  9. History of Ocular herpes. 10. Anxiety.    ALLERGIES: Penicillin and sulfa, both of which cause hives.   HOME MEDICATIONS:  1. Pravachol 80 mg daily.  2. Primidone 250 mg p.o. b.i.d.  3. Vitamin D 50,000 international units p.o. monthly.  4. Omeprazole magnesium 20.6 mg p.o. daily.  5. Prednisolone acetate ophthalmic solution 1% one drop to left eye daily.  6. Alclometasone dipropionate 0.05% topical cream, apply to affected area (perineum) daily.  7. Ativan 0.5 mg p.o. at bedtime.  8. Tylenol p.r.n. The patient uses this frequently per her daughter.   FAMILY HISTORY: Father died at age 76 secondary to cerebrovascular accident. Mother died of natural causes at age 54. The patient had a brother and one sister who died in their 14s of coronary artery disease.   SOCIAL HISTORY: Negative for tobacco, alcohol or illicit drug use. The patient is widowed. She has four children. One daughter accompanies her today. The patient lives in [place], currently at home by herself. She is a retired Museum/gallery curator. The patient is usually able to perform her activities of daily living on her own per the patient's daughter.   REVIEW OF SYSTEMS: CONSTITUTIONAL: Reports generalized weakness and fever. Denies any pain at the moment. Denies recent changes  in weight or fatigue. HEAD/EYES: Denies headache or blurry vision. ENT: Denies tinnitus, earache, nasal discharge, or sore throat. RESPIRATORY: Reports wheezing and shortness of breath. Denies cough or hemoptysis. CARDIOVASCULAR: Denies chest pain, heart palpitations, lower extremity edema or orthopnea. GASTROINTESTINAL: Denies nausea, vomiting, diarrhea, constipation, melena, hematochezia, or abdominal pains. GENITOURINARY: Denies dysuria or hematuria. ENDOCRINE: Denies heat or cold intolerance. HEME/LYMPH: Denies easy bruising or bleeding. INTEGUMENTARY: Denies rash. MUSCULOSKELETAL: Has generalized muscle weakness. Denies back pain at the moment. NEUROLOGIC: Has generalized weakness. Denies headache, numbness, tingling, or dysarthria. PSYCHIATRIC: Denies depression, has underlying anxiety.    PHYSICAL EXAMINATION:  VITAL SIGNS: Temperature 100.5 on arrival, currently 99.9, pulse 102 by vital signs check and 106 on EKG, respirations 20, blood pressure 126/63, oxygen saturation 97% on room air.   GENERAL: The patient is alert and oriented, in mild respiratory distress with audible wheezing. Critically ill-appearing female.  HEENT: Normocephalic, atraumatic. Pupils are equal, round and reactive to light accommodation. Extraocular muscles are intact. Anicteric sclerae. Conjunctivae pink. Hearing intact to voice. Nares without drainage. Oral mucosa is moist without lesions.   NECK: Supple with full range of motion. No jugular venous distention, lymphadenopathy, or carotid bruits bilaterally. No thyromegaly or tenderness to palpation over thyroid gland.   CHEST: Slightly increased respiratory effort. The patient is mildly tachypneic but not using her accessory respiratory muscles to breathe. She has diffuse wheezing bilaterally upper and lower lobes and she  has some bibasilar rales. No rhonchi.   CARDIOVASCULAR: S1, S2 positive. Regular rate and rhythm, 1/6 systolic ejection murmur. PMI is non-lateralized.    ABDOMEN: Soft, nontender, nondistended. Normoactive bowel sounds. No hepatosplenomegaly or palpable masses. No hernias.   EXTREMITIES: No clubbing, cyanosis, or edema. Pedal pulses are palpable bilaterally.   SKIN: No suspicious rashes. Skin turgor is good.   LYMPH: No cervical lymphadenopathy.   NEUROLOGICAL: Alert and oriented x3. Cranial nerves II through XII are grossly intact. No focal deficits. However, the patient is quite tremulous involving her perioral muscles and lips. No facial droop currently and speech is clear. She appears to be globally weak which is symmetric. Sensation is intact. Babinski sign absent bilaterally with toes downgoing and patellar deep tendon reflexes are 2+.   PSYCH: Pleasant female with appropriate affect.   LABORATORY, DIAGNOSTIC, AND RADIOLOGICAL DATA: Urinalysis: Cloudy urine with 3+ leukocyte esterase, positive nitrite, 417 WBCs, 3+ bacteria. CBC is within normal limits except for platelet count of 87. MCV is 101. Complete metabolic panel normal except for AST 397, ALT 97, albumin 3.1. Troponin 0.11. BNP 5,150. INR 1.1. PTT 37.7. Chest x-ray PA and lateral: Mild diffuse interstitial thickening which may reflect mild interstitial edema versus interstitial pneumonitis secondary to infectious or inflammatory etiology. EKG: Sinus tachycardia at 106 beats per minute with normal axis, normal intervals. Some T wave inversions in the lateral leads. No ST elevations, no prior EKGs for comparison in our system.   ASSESSMENT AND PLAN: A 81 year old female with history of hypercholesterolemia, aortic stenosis, PUD x3, with history of gastric and duodenal ulcers with anemia and thrombocytopenia requiring transfusions. Essential tremors, arthritis, rib fractures, no history of pneumonia, here with generalized weakness in addition to shortness of breath and wheezing and fevers, with:  1. Sepsis, manifested by fever, tachycardia and mild diastolic hypotension. Suspect source  could be from urinary tract infection versus possible pneumonia. Recommend hospital admission for further evaluation and management. We will obtain blood and urine cultures and sputum culture if able although she denies cough and start broad-spectrum IV antibiotics with Levaquin as she is penicillin allergic. She also received a dose of IV vancomycin in the ER. We will follow culture data closely and tailor antibiotics accordingly. Further work-up and management to follow depending on the patient's clinical course.  2. Urinary tract infection based off abnormal urinalysis as above. Will obtain blood and urine culture and start empiric IV antibiotics and monitor clinical response.  3. Suspected congestive heart failure with elevated BMP and possible pulmonary edema noted on chest x-ray. She has wheezing, which could be cardiac wheezing versus pneumonitis or pneumonia. The patient has known aortic stenosis and has opted for medical management rather than valve repair/replacement. For suspected congestive heart failure, will obtain echocardiogram. Start diuresis with low-dose IV Lasix, monitor ins and outs strictly and daily weights. We will continue to cycle her cardiac enzymes and obtain cardiology consultation for further recommendations. In the event of pneumonia, as above, blood cultures will be obtained and the patient will be started on IV antibiotics. Will also keep her on IV steroids for her wheezing. Suspect she may have some inflammation in her lungs. We will also provide bronchodilator support and obtain a CT of the chest for better assessment of her lung parenchyma. This was discussed with the patient and her daughter, both of whom are in agreement with the current plan of action.  4. Elevated troponin. I suspect demand ischemia from congestive heart failure plus/minus pneumonia, plus/minus  sepsis. She denies any chest pain. Her EKG shows lateral T wave inversions and not sure if this is old or new as  there are no prior EKGs for comparison in our system. Acute coronary syndrome is less likely, but cannot be completely excluded. Would not keep the patient on antiplatelets or anticoagulants for now given history of peptic ulcer disease x3 in the past and increased risk of bleeding and thrombocytopenia and the patient states she does not wish to receive any "blood thinners". Will manage the suspected underlying causes and manage her congestive heart failure, pneumonia and sepsis as above. Will continue to cycle her cardiac enzymes and obtain echocardiogram and cardiology consultation for further recommendations.  5. Abnormal liver function tests. The patient is asymptomatic. Will stop statin therapy. Per daughter, the patient also takes frequent Tylenol at home. We will check serum acetaminophen level and avoid acetaminophen for now. We will obtain an abdominal ultrasound and viral hepatitis screen. This could also be congestive hepatopathy and will follow her liver function tests with diuresis and consider gastrointestinal evaluation if there is no improvement of her liver function tests.  6. Generalized weakness, likely from urinary tract infection, pneumonia, congestive heart failure, and sepsis, but cannot completely exclude cerebrovascular accident. We will obtain a head CT for now. The patient wants to hold off on MRI at this time. Will see if the patient's condition improves with management of her urinary tract infection, pneumonia, congestive heart failure and sepsis. Will obtain physical therapy evaluation once the patient is more stable, but currently she appears to be critically ill.  7. History of aortic stenosis, now with suspected congestive heart failure. The patient is not interested in valve repair or replacement as above. Continue diuresis with low dose IV Lasix. Monitor ins and outs strictly and daily weights. Obtain echocardiogram and cardiology consultation for further recommendations.   8. History of peptic ulcer disease with gastric and duodenal ulcers. Continue PPI. Avoid NSAIDs. 9. Thrombocytopenia. The patient has a history of it in the past which is likely reactive from gastrointestinal bleed. Now it could be due to sepsis/infection and could also be due to PPI therapy. Currently, there are no indications for platelet transfusion. We will avoid heparin products for now. Will use proton pump inhibitor with caution. Continue antibiotic for infection (urinary tract infection plus or minus pneumonia) and follow platelet counts closely.  10. Essential tremors. Continued Primidone.   11. Hypercholesterolemia. Hold statin given elevated transaminases. Keep the patient on low fat, low cholesterol diet.  12. Deep vein thrombosis prophylaxis. SCDs and TEDs.  13. CODE STATUS: DO NOT RESUSCITATE. This is verbally confirmed by both the patient and her daughter in the ER. (The patient has previously signed a DO NOT RESUSCITATE order as well).   TIME SPENT ON THIS CRITICAL CARE ADMISSION: Approximately 65 minutes.   ____________________________ Elon AlasKamran N. Lockie Bothun, MD knl:ap D: 02/14/2011 15:49:27 ET                T: 02/15/2011 07:03:59 ET JOB#: [id number] cc: Elon AlasKamran N. Caldwell Kronenberger, MD, <Dictator> Leo GrosserNancy J. Maloney, MD Marcina MillardAlexander Paraschos, MD Elon AlasKAMRAN N Leemon Ayala MD ELECTRONICALLY SIGNED 02/18/2011 16:25

## 2014-06-09 NOTE — Discharge Summary (Signed)
PATIENT NAME:  Tricia Mcmahon, Tricia Mcmahon MR#:  [id number] DATE OF BIRTH:  [birth year]  DATE OF ADMISSION:  02/14/2011 DATE OF DISCHARGE:  02/18/2011  PRESENTING COMPLAINT: Feeling weak and short of breath.   DISCHARGE DIAGNOSES:  1. Escherichia coli septicemia, source urine. 2. Escherichia coli urinary tract infection.  3. Suspected acute diastolic heart failure in the setting of sepsis with history of severe aortic stenosis, improved, resolved.  4. Mild elevated troponin due to demand ischemia from congestive heart failure along with sepsis.  5. Peptic ulcer disease.  6. Abnormal liver function tests, asymptomatic. The patient's statins have been discontinued at present.  7. Abdominal aortic aneurysm, 6 cm, on ultrasound.  8. History of severe aortic stenosis.  9. Peptic ulcer disease.  10. Chronic thrombocytopenia.  11. Essential tremors.  12. Hypercholesterolemia.  13. Rhabdomyolysis.  14. Bilateral mouth sores, appears to be stress ulcers.  CODE STATUS: DO NOT RESUSCITATE.   DISCHARGE MEDICATIONS: 1. Ambien 5 mg daily at bedtime as needed.  2. Ativan 0.5 mg at bedtime.  3. Dulcolax 5 mg p.o. daily p.r.n.  4. Duke mouthwash 5 mL three times daily for five days.  5. Levaquin 750 mg daily for four more days.  6. Vitamin D2 10,000 units once a month. 7. Boniva 150 mg p.o. once a month.  8. Albuterol oral inhaler 1 to 2 puffs every six hours p.r.n.  9. Prilosec 20 mg twice a day. 10. Primidone 250 mg twice a day. 11. Prednisolone acetate 1% drops in left eye daily.  CONSULTANTS: Arnoldo HookerBruce Kowalski, MD - Cardiology.  BRIEF SUMMARY OF HOSPITAL COURSE: Ms. Tricia Mcmahon is a pleasant 81 year old Caucasian female who has history of peptic ulcer disease, essential tremors, and history of severe aortic stenosis who was admitted with:   1. Escherichia coli sepsis: Symptoms were manifested by fever, tachycardia, and mild hypotension on admission. She was started on high dose of IV Levaquin. Her blood  cultures on admission were positive for Escherichia coli and urine culture was positive for Escherichia coli too. Repeat blood culture from 02/17/2011 is negative in 18 to 24 hours.  2. Escherichia coli urinary tract infection: Treated as above with Levaquin.  3. Suspected acute diastolic heart failure in the setting of sepsis along with history of severe aortic stenosis: The patient did receive a few doses of Lasix. She diuresed well. Her symptoms improved. Her oxygen was weaned off to room air.  Lasix and nitro patch were discontinued. The patient was seen by Dr. Gwen PoundsKowalski and no further diagnostics were recommended.  Echo showed ejection fraction of more than 55% along with severe AS.  4. Elevated troponin: Appeared to be due to demand ischemia in the setting of AS, sepsis, and mild heart failure, diastolic.  5. Abnormal liver function tests, possibly due to congestive hepatopathy from congestive heart failure: LFTs were trending down. The patient's statins are held and will be held until after discharge when LFTs return back to normal.  6. Rhabdomyolysis, acute: The patient was not able to be given any IV fluids in the setting of heart failure. Her counts were monitored which were trending down and she did not have any symptoms of myalgia.  7. Abdominal aortic aneurysm: The patient was not interested in further work-up. She understands the risk of rupture, bleeding, and death.  8. Generalized weakness: It appeared to be due to worsening by sepsis and congestive heart failure. The patient was evaluated by physical therapy and [facility].  9.  History of severe aortic stenosis.  10. Peptic ulcer disease with gastric ulcers and duodenal ulcers. 11. Chronic thrombocytopenia: Platelet count remained stable.  12. Essential tremors: The patient is on primidone.  13. Hypercholesterolemia: We will hold off on statins for now and resume when LFTs return to baseline. Her lipid  profile is within normal limits.   Her hospital stay otherwise remained stable. The patient remained a NO CODE, DO NOT RESUSCITATE.   TIME SPENT: 40 minutes. ____________________________ Wylie Hail Allena Katz, MD sap:slb D: 02/18/2011 11:26:00 ET T: 02/18/2011 13:46:40 ET JOB#: [id number]  cc: Essa Wenk A. Allena Katz, MD, <Dictator> Lamar Blinks, MD Willow Ora MD ELECTRONICALLY SIGNED 02/18/2011 14:45
